# Patient Record
Sex: Female | Born: 1975
Health system: Southern US, Community
[De-identification: ages and names within clinical notes are randomized; demographics above are authoritative.]

## PROBLEM LIST (undated history)

## (undated) DIAGNOSIS — F32A Depression, unspecified: Secondary | ICD-10-CM

## (undated) DIAGNOSIS — F419 Anxiety disorder, unspecified: Secondary | ICD-10-CM

## (undated) DIAGNOSIS — N6310 Unspecified lump in the right breast, unspecified quadrant: Secondary | ICD-10-CM

## (undated) DIAGNOSIS — F329 Major depressive disorder, single episode, unspecified: Secondary | ICD-10-CM

## (undated) DIAGNOSIS — R519 Headache, unspecified: Secondary | ICD-10-CM

## (undated) DIAGNOSIS — R51 Headache: Secondary | ICD-10-CM

## (undated) HISTORY — PX: BREAST BIOPSY: SHX20

---

## 2014-05-28 ENCOUNTER — Other Ambulatory Visit (HOSPITAL_COMMUNITY)
Admission: RE | Admit: 2014-05-28 | Discharge: 2014-05-28 | Disposition: A | Discharge status: Home / Self Care | Payer: BLUE CROSS/BLUE SHIELD | Source: Ambulatory Visit | Attending: Family Medicine | Admitting: Family Medicine

## 2014-05-28 DIAGNOSIS — Z1151 Encounter for screening for human papillomavirus (HPV): Secondary | ICD-10-CM | POA: Diagnosis present

## 2014-05-28 DIAGNOSIS — Z01419 Encounter for gynecological examination (general) (routine) without abnormal findings: Secondary | ICD-10-CM | POA: Diagnosis present

## 2015-04-07 ENCOUNTER — Other Ambulatory Visit: Payer: Self-pay

## 2015-04-07 DIAGNOSIS — Z1231 Encounter for screening mammogram for malignant neoplasm of breast: Secondary | ICD-10-CM

## 2015-04-07 DIAGNOSIS — Z803 Family history of malignant neoplasm of breast: Secondary | ICD-10-CM

## 2015-05-14 ENCOUNTER — Ambulatory Visit
Admission: RE | Admit: 2015-05-14 | Discharge: 2015-05-14 | Disposition: A | Discharge status: Home / Self Care | Payer: BLUE CROSS/BLUE SHIELD | Source: Ambulatory Visit

## 2015-05-14 ENCOUNTER — Other Ambulatory Visit: Payer: Self-pay | Admitting: Family Medicine

## 2015-05-14 DIAGNOSIS — R928 Other abnormal and inconclusive findings on diagnostic imaging of breast: Secondary | ICD-10-CM

## 2015-05-14 DIAGNOSIS — Z803 Family history of malignant neoplasm of breast: Secondary | ICD-10-CM

## 2015-05-14 DIAGNOSIS — Z1231 Encounter for screening mammogram for malignant neoplasm of breast: Secondary | ICD-10-CM

## 2015-05-20 ENCOUNTER — Other Ambulatory Visit: Payer: Self-pay | Admitting: Family Medicine

## 2015-05-20 ENCOUNTER — Ambulatory Visit
Admission: RE | Admit: 2015-05-20 | Discharge: 2015-05-20 | Disposition: A | Discharge status: Home / Self Care | Payer: BLUE CROSS/BLUE SHIELD | Source: Ambulatory Visit | Attending: Family Medicine | Admitting: Family Medicine

## 2015-05-20 DIAGNOSIS — R928 Other abnormal and inconclusive findings on diagnostic imaging of breast: Secondary | ICD-10-CM

## 2015-05-21 ENCOUNTER — Ambulatory Visit
Admission: RE | Admit: 2015-05-21 | Discharge: 2015-05-21 | Disposition: A | Discharge status: Home / Self Care | Payer: BLUE CROSS/BLUE SHIELD | Source: Ambulatory Visit | Attending: Family Medicine | Admitting: Family Medicine

## 2015-05-21 DIAGNOSIS — R928 Other abnormal and inconclusive findings on diagnostic imaging of breast: Secondary | ICD-10-CM

## 2016-03-12 DIAGNOSIS — E559 Vitamin D deficiency, unspecified: Secondary | ICD-10-CM | POA: Diagnosis not present

## 2016-04-19 ENCOUNTER — Other Ambulatory Visit: Payer: Self-pay | Admitting: Family Medicine

## 2016-04-19 DIAGNOSIS — Z1231 Encounter for screening mammogram for malignant neoplasm of breast: Secondary | ICD-10-CM

## 2016-05-10 HISTORY — PX: BREAST LUMPECTOMY: SHX2

## 2016-05-10 HISTORY — PX: BREAST EXCISIONAL BIOPSY: SUR124

## 2016-05-11 ENCOUNTER — Ambulatory Visit: Payer: BLUE CROSS/BLUE SHIELD

## 2016-05-31 ENCOUNTER — Ambulatory Visit
Admission: RE | Admit: 2016-05-31 | Discharge: 2016-05-31 | Disposition: A | Discharge status: Home / Self Care | Payer: BLUE CROSS/BLUE SHIELD | Source: Ambulatory Visit | Attending: Family Medicine | Admitting: Family Medicine

## 2016-05-31 DIAGNOSIS — Z1231 Encounter for screening mammogram for malignant neoplasm of breast: Secondary | ICD-10-CM

## 2016-06-01 ENCOUNTER — Other Ambulatory Visit: Payer: Self-pay | Admitting: Family Medicine

## 2016-06-01 DIAGNOSIS — R928 Other abnormal and inconclusive findings on diagnostic imaging of breast: Secondary | ICD-10-CM

## 2016-06-03 ENCOUNTER — Other Ambulatory Visit: Payer: Self-pay | Admitting: Family Medicine

## 2016-06-03 ENCOUNTER — Ambulatory Visit
Admission: RE | Admit: 2016-06-03 | Discharge: 2016-06-03 | Disposition: A | Discharge status: Home / Self Care | Payer: BLUE CROSS/BLUE SHIELD | Source: Ambulatory Visit | Attending: Family Medicine | Admitting: Family Medicine

## 2016-06-03 ENCOUNTER — Other Ambulatory Visit: Payer: BLUE CROSS/BLUE SHIELD

## 2016-06-03 DIAGNOSIS — N6310 Unspecified lump in the right breast, unspecified quadrant: Secondary | ICD-10-CM

## 2016-06-03 DIAGNOSIS — N6312 Unspecified lump in the right breast, upper inner quadrant: Secondary | ICD-10-CM | POA: Diagnosis not present

## 2016-06-03 DIAGNOSIS — R928 Other abnormal and inconclusive findings on diagnostic imaging of breast: Secondary | ICD-10-CM

## 2016-06-08 ENCOUNTER — Ambulatory Visit
Admission: RE | Admit: 2016-06-08 | Discharge: 2016-06-08 | Disposition: A | Discharge status: Home / Self Care | Payer: BLUE CROSS/BLUE SHIELD | Source: Ambulatory Visit | Attending: Family Medicine | Admitting: Family Medicine

## 2016-06-08 DIAGNOSIS — N649 Disorder of breast, unspecified: Secondary | ICD-10-CM | POA: Diagnosis not present

## 2016-06-08 DIAGNOSIS — N6310 Unspecified lump in the right breast, unspecified quadrant: Secondary | ICD-10-CM

## 2016-06-08 DIAGNOSIS — N6312 Unspecified lump in the right breast, upper inner quadrant: Secondary | ICD-10-CM | POA: Diagnosis not present

## 2016-06-23 ENCOUNTER — Other Ambulatory Visit: Payer: Self-pay | Admitting: General Surgery

## 2016-06-23 DIAGNOSIS — N6310 Unspecified lump in the right breast, unspecified quadrant: Secondary | ICD-10-CM | POA: Diagnosis not present

## 2016-07-09 ENCOUNTER — Other Ambulatory Visit: Payer: Self-pay | Admitting: General Surgery

## 2016-07-09 DIAGNOSIS — N6310 Unspecified lump in the right breast, unspecified quadrant: Secondary | ICD-10-CM

## 2016-07-13 DIAGNOSIS — Z0000 Encounter for general adult medical examination without abnormal findings: Secondary | ICD-10-CM | POA: Diagnosis not present

## 2016-07-13 DIAGNOSIS — Z79899 Other long term (current) drug therapy: Secondary | ICD-10-CM | POA: Diagnosis not present

## 2016-07-13 DIAGNOSIS — E559 Vitamin D deficiency, unspecified: Secondary | ICD-10-CM | POA: Diagnosis not present

## 2016-07-13 DIAGNOSIS — E785 Hyperlipidemia, unspecified: Secondary | ICD-10-CM | POA: Diagnosis not present

## 2016-07-28 ENCOUNTER — Encounter (HOSPITAL_BASED_OUTPATIENT_CLINIC_OR_DEPARTMENT_OTHER): Payer: Self-pay | Admitting: *Deleted

## 2016-08-03 ENCOUNTER — Ambulatory Visit
Admission: RE | Admit: 2016-08-03 | Discharge: 2016-08-03 | Disposition: A | Discharge status: Home / Self Care | Payer: BLUE CROSS/BLUE SHIELD | Source: Ambulatory Visit | Attending: General Surgery | Admitting: General Surgery

## 2016-08-03 DIAGNOSIS — N6310 Unspecified lump in the right breast, unspecified quadrant: Secondary | ICD-10-CM

## 2016-08-03 NOTE — Progress Notes (Signed)
Boost drink given with instructions, pt verbalized understanding.

## 2016-08-04 ENCOUNTER — Encounter (HOSPITAL_BASED_OUTPATIENT_CLINIC_OR_DEPARTMENT_OTHER)
Admission: RE | Disposition: A | Discharge status: Home / Self Care | Payer: Self-pay | Source: Ambulatory Visit | Attending: General Surgery

## 2016-08-04 ENCOUNTER — Encounter (HOSPITAL_BASED_OUTPATIENT_CLINIC_OR_DEPARTMENT_OTHER): Payer: Self-pay | Admitting: *Deleted

## 2016-08-04 ENCOUNTER — Ambulatory Visit (HOSPITAL_BASED_OUTPATIENT_CLINIC_OR_DEPARTMENT_OTHER)
Admission: RE | Admit: 2016-08-04 | Discharge: 2016-08-04 | Disposition: A | Discharge status: Home / Self Care | Payer: BLUE CROSS/BLUE SHIELD | Source: Ambulatory Visit | Attending: General Surgery | Admitting: General Surgery

## 2016-08-04 ENCOUNTER — Ambulatory Visit (HOSPITAL_BASED_OUTPATIENT_CLINIC_OR_DEPARTMENT_OTHER): Payer: BLUE CROSS/BLUE SHIELD | Admitting: Certified Registered"

## 2016-08-04 ENCOUNTER — Ambulatory Visit
Admission: RE | Admit: 2016-08-04 | Discharge: 2016-08-04 | Disposition: A | Discharge status: Home / Self Care | Payer: BLUE CROSS/BLUE SHIELD | Source: Ambulatory Visit | Attending: General Surgery | Admitting: General Surgery

## 2016-08-04 DIAGNOSIS — Z87891 Personal history of nicotine dependence: Secondary | ICD-10-CM | POA: Diagnosis not present

## 2016-08-04 DIAGNOSIS — F329 Major depressive disorder, single episode, unspecified: Secondary | ICD-10-CM | POA: Insufficient documentation

## 2016-08-04 DIAGNOSIS — E7800 Pure hypercholesterolemia, unspecified: Secondary | ICD-10-CM | POA: Insufficient documentation

## 2016-08-04 DIAGNOSIS — F419 Anxiety disorder, unspecified: Secondary | ICD-10-CM | POA: Insufficient documentation

## 2016-08-04 DIAGNOSIS — D241 Benign neoplasm of right breast: Secondary | ICD-10-CM | POA: Diagnosis not present

## 2016-08-04 DIAGNOSIS — N6310 Unspecified lump in the right breast, unspecified quadrant: Secondary | ICD-10-CM | POA: Diagnosis not present

## 2016-08-04 DIAGNOSIS — N6312 Unspecified lump in the right breast, upper inner quadrant: Secondary | ICD-10-CM | POA: Diagnosis not present

## 2016-08-04 HISTORY — DX: Anxiety disorder, unspecified: F41.9

## 2016-08-04 HISTORY — DX: Headache, unspecified: R51.9

## 2016-08-04 HISTORY — DX: Major depressive disorder, single episode, unspecified: F32.9

## 2016-08-04 HISTORY — DX: Headache: R51

## 2016-08-04 HISTORY — PX: RADIOACTIVE SEED GUIDED EXCISIONAL BREAST BIOPSY: SHX6490

## 2016-08-04 HISTORY — DX: Unspecified lump in the right breast, unspecified quadrant: N63.10

## 2016-08-04 HISTORY — DX: Depression, unspecified: F32.A

## 2016-08-04 SURGERY — RADIOACTIVE SEED GUIDED BREAST BIOPSY
Anesthesia: General | Site: Breast | Laterality: Right

## 2016-08-04 MED ORDER — ACETAMINOPHEN 500 MG PO TABS
1000.0000 mg | ORAL_TABLET | ORAL | Status: AC
  Administered 2016-08-04: 1000 mg via ORAL

## 2016-08-04 MED ORDER — PROPOFOL 10 MG/ML IV BOLUS
INTRAVENOUS | Status: DC | PRN
  Administered 2016-08-04: 150 mg via INTRAVENOUS

## 2016-08-04 MED ORDER — CELECOXIB 200 MG PO CAPS
200.0000 mg | ORAL_CAPSULE | ORAL | Status: AC
  Administered 2016-08-04: 200 mg via ORAL

## 2016-08-04 MED ORDER — CEFAZOLIN SODIUM-DEXTROSE 2-4 GM/100ML-% IV SOLN
2.0000 g | INTRAVENOUS | Status: AC
  Administered 2016-08-04: 2 g via INTRAVENOUS

## 2016-08-04 MED ORDER — BUPIVACAINE HCL (PF) 0.25 % IJ SOLN
INTRAMUSCULAR | Status: DC | PRN
  Administered 2016-08-04: 10 mL

## 2016-08-04 MED ORDER — FENTANYL CITRATE (PF) 100 MCG/2ML IJ SOLN
INTRAMUSCULAR | Status: AC
  Filled 2016-08-04: qty 2

## 2016-08-04 MED ORDER — CELECOXIB 200 MG PO CAPS
ORAL_CAPSULE | ORAL | Status: AC
  Filled 2016-08-04: qty 1

## 2016-08-04 MED ORDER — OXYCODONE HCL 5 MG PO TABS: 5.0000 mg | ORAL_TABLET | Freq: Once | ORAL | Status: DC | PRN

## 2016-08-04 MED ORDER — PROMETHAZINE HCL 25 MG/ML IJ SOLN: 6.2500 mg | INTRAMUSCULAR | Status: DC | PRN

## 2016-08-04 MED ORDER — GABAPENTIN 300 MG PO CAPS
ORAL_CAPSULE | ORAL | Status: AC
  Filled 2016-08-04: qty 1

## 2016-08-04 MED ORDER — CEFAZOLIN SODIUM-DEXTROSE 2-4 GM/100ML-% IV SOLN
INTRAVENOUS | Status: AC
  Filled 2016-08-04: qty 100

## 2016-08-04 MED ORDER — MIDAZOLAM HCL 2 MG/2ML IJ SOLN
INTRAMUSCULAR | Status: AC
  Filled 2016-08-04: qty 2

## 2016-08-04 MED ORDER — LACTATED RINGERS IV SOLN
INTRAVENOUS | Status: DC
  Administered 2016-08-04: 09:00:00 via INTRAVENOUS

## 2016-08-04 MED ORDER — HYDROMORPHONE HCL 1 MG/ML IJ SOLN: 0.2500 mg | INTRAMUSCULAR | Status: DC | PRN

## 2016-08-04 MED ORDER — SCOPOLAMINE 1 MG/3DAYS TD PT72: 1.0000 | MEDICATED_PATCH | Freq: Once | TRANSDERMAL | Status: DC | PRN

## 2016-08-04 MED ORDER — ACETAMINOPHEN 500 MG PO TABS
ORAL_TABLET | ORAL | Status: AC
  Filled 2016-08-04: qty 2

## 2016-08-04 MED ORDER — FENTANYL CITRATE (PF) 100 MCG/2ML IJ SOLN
50.0000 ug | INTRAMUSCULAR | Status: AC | PRN
Start: 2016-08-04 — End: 2016-08-04
  Administered 2016-08-04: 50 ug via INTRAVENOUS
  Administered 2016-08-04 (×2): 25 ug via INTRAVENOUS

## 2016-08-04 MED ORDER — GABAPENTIN 300 MG PO CAPS
300.0000 mg | ORAL_CAPSULE | ORAL | Status: AC
  Administered 2016-08-04: 300 mg via ORAL

## 2016-08-04 MED ORDER — MEPERIDINE HCL 25 MG/ML IJ SOLN: 6.2500 mg | INTRAMUSCULAR | Status: DC | PRN

## 2016-08-04 MED ORDER — LIDOCAINE 2% (20 MG/ML) 5 ML SYRINGE
INTRAMUSCULAR | Status: DC | PRN
  Administered 2016-08-04: 60 mg via INTRAVENOUS

## 2016-08-04 MED ORDER — MIDAZOLAM HCL 2 MG/2ML IJ SOLN
1.0000 mg | INTRAMUSCULAR | Status: DC | PRN
  Administered 2016-08-04: 2 mg via INTRAVENOUS

## 2016-08-04 MED ORDER — OXYCODONE HCL 5 MG/5ML PO SOLN: 5.0000 mg | Freq: Once | ORAL | Status: DC | PRN

## 2016-08-04 SURGICAL SUPPLY — 57 items
BINDER BREAST LRG (GAUZE/BANDAGES/DRESSINGS) ×3 IMPLANT
BINDER BREAST MEDIUM (GAUZE/BANDAGES/DRESSINGS) IMPLANT
BINDER BREAST XLRG (GAUZE/BANDAGES/DRESSINGS) IMPLANT
BINDER BREAST XXLRG (GAUZE/BANDAGES/DRESSINGS) IMPLANT
BLADE SURG 15 STRL LF DISP TIS (BLADE) ×1 IMPLANT
BLADE SURG 15 STRL SS (BLADE) ×2
CANISTER SUC SOCK COL 7IN (MISCELLANEOUS) IMPLANT
CANISTER SUCT 1200ML W/VALVE (MISCELLANEOUS) ×3 IMPLANT
CHLORAPREP W/TINT 26ML (MISCELLANEOUS) ×3 IMPLANT
CLIP TI WIDE RED SMALL 6 (CLIP) ×3 IMPLANT
CLOSURE WOUND 1/2 X4 (GAUZE/BANDAGES/DRESSINGS) ×1
COVER BACK TABLE 60X90IN (DRAPES) ×3 IMPLANT
COVER MAYO STAND STRL (DRAPES) ×3 IMPLANT
COVER PROBE W GEL 5X96 (DRAPES) ×3 IMPLANT
DECANTER SPIKE VIAL GLASS SM (MISCELLANEOUS) IMPLANT
DERMABOND ADVANCED (GAUZE/BANDAGES/DRESSINGS) ×2
DERMABOND ADVANCED .7 DNX12 (GAUZE/BANDAGES/DRESSINGS) ×1 IMPLANT
DEVICE DUBIN W/COMP PLATE 8390 (MISCELLANEOUS) ×3 IMPLANT
DRAPE LAPAROSCOPIC ABDOMINAL (DRAPES) ×3 IMPLANT
DRAPE UTILITY XL STRL (DRAPES) ×3 IMPLANT
DRSG TEGADERM 4X4.75 (GAUZE/BANDAGES/DRESSINGS) IMPLANT
ELECT COATED BLADE 2.86 ST (ELECTRODE) ×3 IMPLANT
ELECT REM PT RETURN 9FT ADLT (ELECTROSURGICAL) ×3
ELECTRODE REM PT RTRN 9FT ADLT (ELECTROSURGICAL) ×1 IMPLANT
GAUZE SPONGE 4X4 12PLY STRL LF (GAUZE/BANDAGES/DRESSINGS) IMPLANT
GLOVE BIO SURGEON STRL SZ 6.5 (GLOVE) ×2 IMPLANT
GLOVE BIO SURGEON STRL SZ7 (GLOVE) ×9 IMPLANT
GLOVE BIO SURGEONS STRL SZ 6.5 (GLOVE) ×1
GLOVE BIOGEL PI IND STRL 7.5 (GLOVE) ×1 IMPLANT
GLOVE BIOGEL PI INDICATOR 7.5 (GLOVE) ×2
GOWN STRL REUS W/ TWL LRG LVL3 (GOWN DISPOSABLE) ×3 IMPLANT
GOWN STRL REUS W/TWL LRG LVL3 (GOWN DISPOSABLE) ×6
HEMOSTAT ARISTA ABSORB 3G PWDR (MISCELLANEOUS) IMPLANT
ILLUMINATOR WAVEGUIDE N/F (MISCELLANEOUS) IMPLANT
KIT MARKER MARGIN INK (KITS) ×3 IMPLANT
LIGHT WAVEGUIDE WIDE FLAT (MISCELLANEOUS) IMPLANT
NEEDLE HYPO 25X1 1.5 SAFETY (NEEDLE) ×3 IMPLANT
NS IRRIG 1000ML POUR BTL (IV SOLUTION) ×3 IMPLANT
PACK BASIN DAY SURGERY FS (CUSTOM PROCEDURE TRAY) ×3 IMPLANT
PENCIL BUTTON HOLSTER BLD 10FT (ELECTRODE) ×3 IMPLANT
SLEEVE SCD COMPRESS KNEE MED (MISCELLANEOUS) ×3 IMPLANT
SPONGE LAP 4X18 X RAY DECT (DISPOSABLE) ×3 IMPLANT
STRIP CLOSURE SKIN 1/2X4 (GAUZE/BANDAGES/DRESSINGS) ×2 IMPLANT
SUT MNCRL AB 4-0 PS2 18 (SUTURE) IMPLANT
SUT MON AB 5-0 PS2 18 (SUTURE) ×3 IMPLANT
SUT SILK 2 0 SH (SUTURE) IMPLANT
SUT VIC AB 2-0 SH 27 (SUTURE) ×2
SUT VIC AB 2-0 SH 27XBRD (SUTURE) ×1 IMPLANT
SUT VIC AB 3-0 SH 27 (SUTURE) ×2
SUT VIC AB 3-0 SH 27X BRD (SUTURE) ×1 IMPLANT
SUT VIC AB 5-0 PS2 18 (SUTURE) IMPLANT
SYR CONTROL 10ML LL (SYRINGE) ×3 IMPLANT
TOWEL OR 17X24 6PK STRL BLUE (TOWEL DISPOSABLE) ×3 IMPLANT
TOWEL OR NON WOVEN STRL DISP B (DISPOSABLE) ×3 IMPLANT
TUBE CONNECTING 20'X1/4 (TUBING) ×1
TUBE CONNECTING 20X1/4 (TUBING) ×2 IMPLANT
YANKAUER SUCT BULB TIP NO VENT (SUCTIONS) ×3 IMPLANT

## 2016-08-04 NOTE — Discharge Instructions (Signed)
[facility] °Office Phone Number [phone] ° °POST OP INSTRUCTIONS ° °Always review your discharge instruction sheet given to you by the facility where your surgery was performed. ° °IF YOU HAVE DISABILITY OR FAMILY LEAVE FORMS, YOU MUST BRING THEM TO THE OFFICE FOR PROCESSING.  DO NOT GIVE THEM TO YOUR DOCTOR. ° °1. A prescription for pain medication may be given to you upon discharge.  Take your pain medication as prescribed, if needed.  If narcotic pain medicine is not needed, then you may take acetaminophen (Tylenol), naprosyn (Alleve) or ibuprofen (Advil) as needed. °2. Take your usually prescribed medications unless otherwise directed °3. If you need a refill on your pain medication, please contact your pharmacy.  They will contact our office to request authorization.  Prescriptions will not be filled after 5pm or on week-ends. °4. You should eat very light the first 24 hours after surgery, such as soup, crackers, pudding, etc.  Resume your normal diet the day after surgery. °5. Most patients will experience some swelling and bruising in the breast.  Ice packs and a good support bra will help.  Wear the breast binder provided or a sports bra for 72 hours day and night.  After that wear a sports bra during the day until you return to the office. Swelling and bruising can take several days to resolve.  °6. It is common to experience some constipation if taking pain medication after surgery.  Increasing fluid intake and taking a stool softener will usually help or prevent this problem from occurring.  A mild laxative (Milk of Magnesia or Miralax) should be taken according to package directions if there are no bowel movements after 48 hours. °7. Unless discharge instructions indicate otherwise, you may remove your bandages 48 hours after surgery and you may shower at that time.  You may have steri-strips (small skin tapes) in place directly over the incision.  These strips should be left on the  skin for 7-10 days and will come off on their own.  If your surgeon used skin glue on the incision, you may shower in 24 hours.  The glue will flake off over the next 2-3 weeks.  Any sutures or staples will be removed at the office during your follow-up visit. °8. ACTIVITIES:  You may resume regular daily activities (gradually increasing) beginning the next day.  Wearing a good support bra or sports bra minimizes pain and swelling.  You may have sexual intercourse when it is comfortable. °a. You may drive when you no longer are taking prescription pain medication, you can comfortably wear a seatbelt, and you can safely maneuver your car and apply brakes. °b. RETURN TO WORK:  ______________________________________________________________________________________ °9. You should see your doctor in the office for a follow-up appointment approximately two weeks after your surgery.  Your doctor’s nurse will typically make your follow-up appointment when she calls you with your pathology report.  Expect your pathology report 3-4 business days after your surgery.  You may call to check if you do not hear from us after three days. °10. OTHER INSTRUCTIONS: _______________________________________________________________________________________________ _____________________________________________________________________________________________________________________________________ °_____________________________________________________________________________________________________________________________________ °_____________________________________________________________________________________________________________________________________ ° °WHEN TO CALL DR WAKEFIELD: °1. Fever over 101.0 °2. Nausea and/or vomiting. °3. Extreme swelling or bruising. °4. Continued bleeding from incision. °5. Increased pain, redness, or drainage from the incision. ° °The clinic staff is available to answer your questions during regular  business hours.  Please don’t hesitate to call and ask to speak to one of the nurses for clinical concerns.  If   you have a medical emergency, go to the nearest emergency room or call 911.  A surgeon from [facility] is always on call at the hospital. ° °For further questions, please visit [email or web address] ° ° ° ° ° °Post Anesthesia Home Care Instructions ° °Activity: °Get plenty of rest for the remainder of the day. A responsible individual must stay with you for 24 hours following the procedure.  °For the next 24 hours, DO NOT: °-Drive a car °-Operate machinery °-Drink alcoholic beverages °-Take any medication unless instructed by your physician °-Make any legal decisions or sign important papers. ° °Meals: °Start with liquid foods such as gelatin or soup. Progress to regular foods as tolerated. Avoid greasy, spicy, heavy foods. If nausea and/or vomiting occur, drink only clear liquids until the nausea and/or vomiting subsides. Call your physician if vomiting continues. ° °Special Instructions/Symptoms: °Your throat may feel dry or sore from the anesthesia or the breathing tube placed in your throat during surgery. If this causes discomfort, gargle with warm salt water. The discomfort should disappear within 24 hours. ° °If you had a scopolamine patch placed behind your ear for the management of post- operative nausea and/or vomiting: ° °1. The medication in the patch is effective for 72 hours, after which it should be removed.  Wrap patch in a tissue and discard in the trash. Wash hands thoroughly with soap and water. °2. You may remove the patch earlier than 72 hours if you experience unpleasant side effects which may include dry mouth, dizziness or visual disturbances. °3. Avoid touching the patch. Wash your hands with soap and water after contact with the patch. °  ° °

## 2016-08-04 NOTE — H&P (Signed)
41 yof referred by Dr Jonathon Jordan for abnl mm. she has fh in her mom who recently passed from breast cancer. no real personal history. she has no mass or dc. she underwent screening mm with c density breasts. there is a 7x7x9 mm mass at 2 oclock 2 cm from the nipple and there is a 9x3x6 mm mass 6 cm from nipple. the 6 cm one appears to be im node and is recommended six month follow up. the other underwent core and is a biphasic lesion. she is here to discuss options as differential includes a low grade phyllodes tumor   Past Surgical History  Breast Biopsy  Bilateral. Cesarean Section - 1   Diagnostic Studies History Colonoscopy  never Mammogram  within last year Pap Smear  1-5 years ago  Allergies  No Known Drug Allergies   Medication History  Sertraline HCl (50MG  Tablet, Oral) Active. Vitamin D (Cholecalciferol) (1000UNIT Tablet, Oral) Active. Medications Reconciled  Social History Alcohol use  Moderate alcohol use. Caffeine use  Carbonated beverages, Coffee, Tea. Illicit drug use  Remotely quit drug use. Tobacco use  Former smoker.  Family History  Alcohol Abuse  Sister. Arthritis  Father, Mother. Breast Cancer  Mother. Colon Cancer  Family Members In General. Depression  Mother, Sister. Diabetes Mellitus  Mother, Sister. Heart Disease  Father. Hypertension  Father. Kidney Disease  Son. Malignant Neoplasm Of Pancreas  Family Members In General.  Pregnancy / Birth History Age at menarche  41 years. Gravida  3 Maternal age  42-20 Para  1 Regular periods   Other Problems  Anxiety Disorder  Depression  Hypercholesterolemia  Lump In Breast  Migraine Headache   Review of Systems General Present- Fatigue. Not Present- Appetite Loss, Chills, Fever, Night Sweats, Weight Gain and Weight Loss. Skin Not Present- Change in Wart/Mole, Dryness, Hives, Jaundice, New Lesions, Non-Healing Wounds, Rash and Ulcer. HEENT Present- Sinus  Pain. Not Present- Earache, Hearing Loss, Hoarseness, Nose Bleed, Oral Ulcers, Ringing in the Ears, Seasonal Allergies, Sore Throat, Visual Disturbances, Wears glasses/contact lenses and Yellow Eyes. Respiratory Present- Snoring. Not Present- Bloody sputum, Chronic Cough, Difficulty Breathing and Wheezing. Breast Present- Breast Mass. Not Present- Breast Pain, Nipple Discharge and Skin Changes. Cardiovascular Not Present- Chest Pain, Difficulty Breathing Lying Down, Leg Cramps, Palpitations, Rapid Heart Rate, Shortness of Breath and Swelling of Extremities. Gastrointestinal Not Present- Abdominal Pain, Bloating, Bloody Stool, Change in Bowel Habits, Chronic diarrhea, Constipation, Difficulty Swallowing, Excessive gas, Gets full quickly at meals, Hemorrhoids, Indigestion, Nausea, Rectal Pain and Vomiting. Female Genitourinary Not Present- Frequency, Nocturia, Painful Urination, Pelvic Pain and Urgency. Musculoskeletal Present- Joint Pain. Not Present- Back Pain, Joint Stiffness, Muscle Pain, Muscle Weakness and Swelling of Extremities. Neurological Not Present- Decreased Memory, Fainting, Headaches, Numbness, Seizures, Tingling, Tremor, Trouble walking and Weakness. Psychiatric Present- Anxiety and Depression. Not Present- Bipolar, Change in Sleep Pattern, Fearful and Frequent crying. Endocrine Not Present- Cold Intolerance, Excessive Hunger, Hair Changes, Heat Intolerance, Hot flashes and New Diabetes. Hematology Not Present- Blood Thinners, Easy Bruising, Excessive bleeding, Gland problems, HIV and Persistent Infections.  Vitals  Weight: 166 lb Height: 63in Body Surface Area: 1.79 m Body Mass Index: 29.41 kg/m  Temp.: 97.56F  Pulse: 70 (Regular)  P.OX: 98% (Room air) BP: 110/82 (Sitting, Left Arm, Standard) Physical Exam  General Mental Status-Alert. Orientation-Oriented X3. Chest and Lung Exam Chest and lung exam reveals -on auscultation, normal breath sounds, no  adventitious sounds and normal vocal resonance. Breast Nipples-No Discharge. Breast Lump-No Palpable Breast Mass.  Cardiovascular Cardiovascular examination reveals -normal heart sounds, regular rate and rhythm with no murmurs. Lymphatic Head & Neck General Head & Neck Lymphatics: Bilateral - Description - Normal. Axillary General Axillary Region: Bilateral - Description - Normal. Note: no Roosevelt adenopathy   Assessment & Plan  MASS OF RIGHT BREAST ON MAMMOGRAM (N63.10) Story: right breast seed guided excisional biopsy discussed options of observation vs excision. I think with fh and her age excision reasonable. we discussed surgery, recovery and risks. will proceed when she decides

## 2016-08-04 NOTE — Anesthesia Preprocedure Evaluation (Signed)
Anesthesia Evaluation  Patient identified by MRN, date of birth, ID band Patient awake    Reviewed: Allergy & Precautions, NPO status , Patient's Chart, lab work & pertinent test results  Airway Mallampati: II  TM Distance: >3 FB Neck ROM: Full    Dental no notable dental hx.    Pulmonary former smoker,    Pulmonary exam normal breath sounds clear to auscultation       Cardiovascular negative cardio ROS Normal cardiovascular exam Rhythm:Regular Rate:Normal     Neuro/Psych  Headaches, negative psych ROS   GI/Hepatic negative GI ROS, Neg liver ROS,   Endo/Other  negative endocrine ROS  Renal/GU negative Renal ROS     Musculoskeletal negative musculoskeletal ROS (+)   Abdominal   Peds  Hematology negative hematology ROS (+)   Anesthesia Other Findings   Reproductive/Obstetrics negative OB ROS                            Anesthesia Physical Anesthesia Plan  ASA: II  Anesthesia Plan: General   Post-op Pain Management:    Induction: Intravenous  Airway Management Planned: LMA  Additional Equipment:   Intra-op Plan:   Post-operative Plan: Extubation in OR  Informed Consent: I have reviewed the patients History and Physical, chart, labs and discussed the procedure including the risks, benefits and alternatives for the proposed anesthesia with the patient or authorized representative who has indicated his/her understanding and acceptance.   Dental advisory given  Plan Discussed with: CRNA  Anesthesia Plan Comments:         Anesthesia Quick Evaluation

## 2016-08-04 NOTE — Anesthesia Procedure Notes (Signed)
Procedure Name: LMA Insertion Date/Time: 08/04/2016 10:08 AM Performed by: Orlondo Holycross D Pre-anesthesia Checklist: Patient identified, Emergency Drugs available, Suction available and Patient being monitored Patient Re-evaluated:Patient Re-evaluated prior to inductionOxygen Delivery Method: Circle system utilized Preoxygenation: Pre-oxygenation with 100% oxygen Intubation Type: IV induction Ventilation: Mask ventilation without difficulty LMA: LMA inserted LMA Size: 4.0 Number of attempts: 1 Airway Equipment and Method: Bite block Placement Confirmation: positive ETCO2 Tube secured with: Tape Dental Injury: Teeth and Oropharynx as per pre-operative assessment

## 2016-08-04 NOTE — Transfer of Care (Signed)
Immediate Anesthesia Transfer of Care Note  Patient: Wanda Melton  Procedure(s) Performed: Procedure(s): RADIOACTIVE SEED GUIDED EXCISIONAL RIGHT BREAST BIOPSY (Right)  Patient Location: PACU  Anesthesia Type:General  Level of Consciousness: awake and patient cooperative  Airway & Oxygen Therapy: Patient Spontanous Breathing and Patient connected to face mask oxygen  Post-op Assessment: Report given to RN and Post -op Vital signs reviewed and stable  Post vital signs: Reviewed and stable  Last Vitals:  Vitals:   08/04/16 0834  BP: 121/71  Pulse: 87  Resp: 20  Temp: 36.7 C    Last Pain:  Vitals:   08/04/16 0834  TempSrc: Oral         Complications: No apparent anesthesia complications

## 2016-08-04 NOTE — Op Note (Signed)
Preoperative diagnoses: right breast mass with core c/w fibroepithelial lesion cannot rule out phyllodes tumor Postoperative diagnosis: Same as above Procedure:Rightbreast seed guided excisional biopsy Surgeon: Dr. Serita Grammes Anesthesia: Gen. Estimated blood loss: minimal Complications: None Drains: None Specimens:Rightbreast tissue with paint Sponge and needle count correct at completion Disposition to recovery stable  Indications: This is a 37 yof with right breast mass on mm that underwent core biopsy and is a fibroepithelial lesion We discussed excisional biopsy using seed guidance. She had seed placed prior to beginning and the mammogramswere in the room.   Procedure: After informed consent was obtained she was then taken to the operating room. She was given cefazolin. Sequential compression devices were on her legs. She was placed under general anesthesia without complication. Her rigth breast was then prepped and draped in the standard sterile surgical fashion. A surgical timeout was then performed.  I located the radioactive seed with the neoprobe. I infiltrated marcaine in the area of the seed. I made a periareolar incision to hide the scar. I then used the neoprobe to guide the excision of the seed and surrounding tissue.This was confirmed by the neoprobe. This was then taken for mammogram which confirmed removal of the seed and the clip. This was confirmed by radiology. This was then sent to pathology. Hemostasis was observed.I closed the breast tissue with a 2-0 Vicryl. The dermis was closed with 3-0 Vicryl and the skin with 5-0 Monocryl.Dermabond and steristrips were placed on the incision. A breast binder was placed. She was transferred to recovery stable

## 2016-08-04 NOTE — Interval H&P Note (Signed)
History and Physical Interval Note:  08/04/2016 9:47 AM  Wanda Melton  has presented today for surgery, with the diagnosis of RIGHT BREAST MASS  The various methods of treatment have been discussed with the patient and family. After consideration of risks, benefits and other options for treatment, the patient has consented to  Procedure(s): RADIOACTIVE SEED GUIDED EXCISIONAL RIGHT BREAST BIOPSY (Right) as a surgical intervention .  The patient's history has been reviewed, patient examined, no change in status, stable for surgery.  I have reviewed the patient's chart and labs.  Questions were answered to the patient's satisfaction.     Terianna Peggs

## 2016-08-05 ENCOUNTER — Encounter (HOSPITAL_BASED_OUTPATIENT_CLINIC_OR_DEPARTMENT_OTHER): Payer: Self-pay | Admitting: General Surgery

## 2016-08-05 NOTE — Anesthesia Postprocedure Evaluation (Signed)
Anesthesia Post Note  Patient: Wanda Melton  Procedure(s) Performed: Procedure(s) (LRB): RADIOACTIVE SEED GUIDED EXCISIONAL RIGHT BREAST BIOPSY (Right)  Patient location during evaluation: PACU Anesthesia Type: General Level of consciousness: sedated and patient cooperative Pain management: pain level controlled Vital Signs Assessment: post-procedure vital signs reviewed and stable Respiratory status: spontaneous breathing Cardiovascular status: stable Anesthetic complications: no       Last Vitals:  Vitals:   08/04/16 1115 08/04/16 1134  BP: 118/71 121/76  Pulse: (!) 101 81  Resp: (!) 44 16  Temp:  36.4 C    Last Pain:  Vitals:   08/04/16 1134  TempSrc: Oral  PainSc: 0-No pain                 Nolon Nations

## 2016-12-24 DIAGNOSIS — N6001 Solitary cyst of right breast: Secondary | ICD-10-CM | POA: Diagnosis not present

## 2017-05-04 ENCOUNTER — Other Ambulatory Visit: Payer: Self-pay | Admitting: Family Medicine

## 2017-05-04 DIAGNOSIS — Z1231 Encounter for screening mammogram for malignant neoplasm of breast: Secondary | ICD-10-CM

## 2017-06-06 ENCOUNTER — Ambulatory Visit
Admission: RE | Admit: 2017-06-06 | Discharge: 2017-06-06 | Disposition: A | Discharge status: Home / Self Care | Payer: BLUE CROSS/BLUE SHIELD | Source: Ambulatory Visit | Attending: Family Medicine | Admitting: Family Medicine

## 2017-06-06 DIAGNOSIS — Z1231 Encounter for screening mammogram for malignant neoplasm of breast: Secondary | ICD-10-CM | POA: Diagnosis not present

## 2017-08-09 ENCOUNTER — Other Ambulatory Visit (HOSPITAL_COMMUNITY)
Admission: RE | Admit: 2017-08-09 | Discharge: 2017-08-09 | Disposition: A | Discharge status: Home / Self Care | Payer: BLUE CROSS/BLUE SHIELD | Source: Ambulatory Visit | Attending: Family Medicine | Admitting: Family Medicine

## 2017-08-09 ENCOUNTER — Other Ambulatory Visit: Payer: Self-pay | Admitting: Family Medicine

## 2017-08-09 DIAGNOSIS — Z124 Encounter for screening for malignant neoplasm of cervix: Secondary | ICD-10-CM | POA: Diagnosis not present

## 2017-08-09 DIAGNOSIS — N920 Excessive and frequent menstruation with regular cycle: Secondary | ICD-10-CM | POA: Diagnosis not present

## 2017-08-09 DIAGNOSIS — Z0000 Encounter for general adult medical examination without abnormal findings: Secondary | ICD-10-CM | POA: Diagnosis not present

## 2017-08-09 DIAGNOSIS — E785 Hyperlipidemia, unspecified: Secondary | ICD-10-CM | POA: Diagnosis not present

## 2017-08-09 DIAGNOSIS — E559 Vitamin D deficiency, unspecified: Secondary | ICD-10-CM | POA: Diagnosis not present

## 2017-08-09 DIAGNOSIS — Z01411 Encounter for gynecological examination (general) (routine) with abnormal findings: Secondary | ICD-10-CM | POA: Diagnosis not present

## 2017-08-09 DIAGNOSIS — Z79899 Other long term (current) drug therapy: Secondary | ICD-10-CM | POA: Diagnosis not present

## 2017-08-11 LAB — CYTOLOGY - PAP: Diagnosis: NEGATIVE

## 2017-08-12 DIAGNOSIS — N920 Excessive and frequent menstruation with regular cycle: Secondary | ICD-10-CM | POA: Diagnosis not present

## 2017-09-23 DIAGNOSIS — N959 Unspecified menopausal and perimenopausal disorder: Secondary | ICD-10-CM | POA: Diagnosis not present

## 2017-09-23 DIAGNOSIS — N939 Abnormal uterine and vaginal bleeding, unspecified: Secondary | ICD-10-CM | POA: Diagnosis not present

## 2017-10-06 DIAGNOSIS — L400 Psoriasis vulgaris: Secondary | ICD-10-CM | POA: Diagnosis not present

## 2017-10-06 DIAGNOSIS — M7711 Lateral epicondylitis, right elbow: Secondary | ICD-10-CM | POA: Diagnosis not present

## 2017-10-18 ENCOUNTER — Ambulatory Visit (INDEPENDENT_AMBULATORY_CARE_PROVIDER_SITE_OTHER): Payer: BLUE CROSS/BLUE SHIELD

## 2017-10-18 ENCOUNTER — Ambulatory Visit (INDEPENDENT_AMBULATORY_CARE_PROVIDER_SITE_OTHER): Payer: BLUE CROSS/BLUE SHIELD | Admitting: Orthopaedic Surgery

## 2017-10-18 DIAGNOSIS — M25521 Pain in right elbow: Secondary | ICD-10-CM

## 2017-10-18 MED ORDER — PREDNISONE 10 MG (21) PO TBPK: ORAL_TABLET | ORAL | 0 refills | Status: AC

## 2017-10-18 NOTE — Progress Notes (Signed)
Office Visit Note   Patient: Wanda Melton           Date of Birth: 02-28-1976           MRN: [id number] Visit Date: 10/18/2017              Requested by: Devra Dopp, MD [place], [place], [place] [address] PCP: Jonathon Jordan, MD   Assessment & Plan: Visit Diagnoses:  1. Pain in right elbow     Plan: the patient appears to be most symptomatic at the radiocapitellar joint with pronation and supination.  We proposed cortisone injection vs. Prednisone taper.  Patient would like to try a prednisone taper first.  She will follow up with [place] as needed.  Call with concerns or questions.  Follow-Up Instructions: Return if symptoms worsen or fail to improve.   Orders:  Orders Placed This Encounter  Procedures  . XR Elbow Complete Right (3+View)   Meds ordered this encounter  Medications  . predniSONE (STERAPRED UNI-PAK 21 TAB) 10 MG (21) TBPK tablet    Sig: Take as directed    Dispense:  21 tablet    Refill:  0      Procedures: No procedures performed   Clinical Data: No additional findings.   Subjective: Chief Complaint  Patient presents with  . Right Elbow - Pain    HPI patient is a pleasant 42 year old right-hand-dominant female who presents to our clinic today with right elbow pain.  The pain she has is been ongoing for the past 4 to 5 months.  It has not worsened but it has not improved either.  The pain is to the lateral aspect.  Pain is worse with the extremes of flexion, extension as well as supination.  She also has pain at night which wakes her from her sleep.  Nothing seems to improve her symptoms.  She has taken some Aleve without relief.  No numbness, tingling or burning.    Review of Systems as detailed in HPI.  All others reviewed and are negative.   Objective: Vital Signs: There were no vitals taken for this visit.  Physical Exam well-developed well-nourished female in no acute distress.  Alert and oriented x3.  Ortho  Exam examination of the right elbow reveals minimal tenderness over the lateral epicondyle.  Mild tenderness over the radial head.  She does have moderate tenderness to the soft spot between the olecranon and radial head.  No elbow joint effusion.  Increased pain at the radiocapitellar joint with supination and pronation of the forearm as well as full extension and flexion.  No pain with gripping or wrist flexion or extension.  She is neurovascularly intact distally.  Negative Tinel radial tunnel.  No pain with resisted ECRB or wrist extension.  Specialty Comments:  No specialty comments available.  Imaging: Xr Elbow Complete Right (3+view)  Result Date: 10/18/2017 No acute or structural abnormalities    PMFS History: Patient Active Problem List   Diagnosis Date Noted  . Pain in right elbow 10/18/2017   Past Medical History:  Diagnosis Date  . Anxiety   . Breast mass, right   . Depression   . Headache     No family history on file.  Past Surgical History:  Procedure Laterality Date  . BREAST BIOPSY Left   . BREAST LUMPECTOMY Right 2018   Benign  . CESAREAN SECTION    . RADIOACTIVE SEED GUIDED EXCISIONAL BREAST BIOPSY Right 08/04/2016   Procedure: RADIOACTIVE  SEED GUIDED EXCISIONAL RIGHT BREAST BIOPSY;  Surgeon: Rolm Bookbinder, MD;  Location: [facility];  Service: General;  Laterality: Right;   Social History   Occupational History  . Not on file  Tobacco Use  . Smoking status: Former Research scientist (life sciences)  . Smokeless tobacco: Never Used  Substance and Sexual Activity  . Alcohol use: Yes    Comment: social  . Drug use: No  . Sexual activity: Not on file

## 2017-12-27 DIAGNOSIS — N939 Abnormal uterine and vaginal bleeding, unspecified: Secondary | ICD-10-CM | POA: Diagnosis not present

## 2017-12-27 DIAGNOSIS — N959 Unspecified menopausal and perimenopausal disorder: Secondary | ICD-10-CM | POA: Diagnosis not present

## 2018-01-10 DIAGNOSIS — M2550 Pain in unspecified joint: Secondary | ICD-10-CM | POA: Diagnosis not present

## 2018-01-10 DIAGNOSIS — R5383 Other fatigue: Secondary | ICD-10-CM | POA: Diagnosis not present

## 2018-02-20 DIAGNOSIS — M2550 Pain in unspecified joint: Secondary | ICD-10-CM | POA: Diagnosis not present

## 2018-02-20 DIAGNOSIS — L400 Psoriasis vulgaris: Secondary | ICD-10-CM | POA: Diagnosis not present

## 2018-03-24 ENCOUNTER — Other Ambulatory Visit: Payer: Self-pay | Admitting: Family Medicine

## 2018-03-24 DIAGNOSIS — Z1231 Encounter for screening mammogram for malignant neoplasm of breast: Secondary | ICD-10-CM

## 2018-05-23 DIAGNOSIS — M2550 Pain in unspecified joint: Secondary | ICD-10-CM | POA: Diagnosis not present

## 2018-05-23 DIAGNOSIS — L400 Psoriasis vulgaris: Secondary | ICD-10-CM | POA: Diagnosis not present

## 2018-06-07 ENCOUNTER — Ambulatory Visit
Admission: RE | Admit: 2018-06-07 | Discharge: 2018-06-07 | Disposition: A | Discharge status: Home / Self Care | Payer: BLUE CROSS/BLUE SHIELD | Source: Ambulatory Visit | Attending: Family Medicine | Admitting: Family Medicine

## 2018-06-07 DIAGNOSIS — Z1231 Encounter for screening mammogram for malignant neoplasm of breast: Secondary | ICD-10-CM

## 2018-10-06 DIAGNOSIS — L409 Psoriasis, unspecified: Secondary | ICD-10-CM | POA: Diagnosis not present

## 2018-10-06 DIAGNOSIS — F329 Major depressive disorder, single episode, unspecified: Secondary | ICD-10-CM | POA: Diagnosis not present

## 2018-10-23 DIAGNOSIS — M2550 Pain in unspecified joint: Secondary | ICD-10-CM | POA: Diagnosis not present

## 2018-10-23 DIAGNOSIS — L400 Psoriasis vulgaris: Secondary | ICD-10-CM | POA: Diagnosis not present

## 2018-11-24 DIAGNOSIS — Z01411 Encounter for gynecological examination (general) (routine) with abnormal findings: Secondary | ICD-10-CM | POA: Diagnosis not present

## 2018-12-13 DIAGNOSIS — R234 Changes in skin texture: Secondary | ICD-10-CM | POA: Diagnosis not present

## 2018-12-14 ENCOUNTER — Other Ambulatory Visit: Payer: Self-pay | Admitting: Family Medicine

## 2018-12-14 DIAGNOSIS — R234 Changes in skin texture: Secondary | ICD-10-CM

## 2018-12-21 ENCOUNTER — Ambulatory Visit: Payer: BLUE CROSS/BLUE SHIELD

## 2018-12-21 ENCOUNTER — Ambulatory Visit
Admission: RE | Admit: 2018-12-21 | Discharge: 2018-12-21 | Disposition: A | Discharge status: Home / Self Care | Payer: BLUE CROSS/BLUE SHIELD | Source: Ambulatory Visit | Attending: Family Medicine | Admitting: Family Medicine

## 2018-12-21 ENCOUNTER — Other Ambulatory Visit: Payer: Self-pay

## 2018-12-21 DIAGNOSIS — R922 Inconclusive mammogram: Secondary | ICD-10-CM | POA: Diagnosis not present

## 2018-12-21 DIAGNOSIS — R234 Changes in skin texture: Secondary | ICD-10-CM

## 2019-03-17 DIAGNOSIS — Z20828 Contact with and (suspected) exposure to other viral communicable diseases: Secondary | ICD-10-CM | POA: Diagnosis not present

## 2019-04-02 ENCOUNTER — Other Ambulatory Visit: Payer: Self-pay

## 2019-04-02 DIAGNOSIS — Z20822 Contact with and (suspected) exposure to covid-19: Secondary | ICD-10-CM

## 2019-04-04 LAB — NOVEL CORONAVIRUS, NAA: SARS-CoV-2, NAA: NOT DETECTED

## 2019-04-30 ENCOUNTER — Other Ambulatory Visit: Payer: Self-pay | Admitting: Family Medicine

## 2019-04-30 DIAGNOSIS — Z1231 Encounter for screening mammogram for malignant neoplasm of breast: Secondary | ICD-10-CM

## 2019-05-12 IMAGING — MG 2D DIGITAL SCREENING BILATERAL MAMMOGRAM WITH 3D TOMO WITH CAD
8 of 12 series · 8 of 28 positions shown · non-contrast
Comparison: Previous exam(s).

CLINICAL DATA: Screening.

EXAM:
2D DIGITAL SCREENING BILATERAL MAMMOGRAM WITH 3D TOMO WITH CAD

[L CC synth-2D]
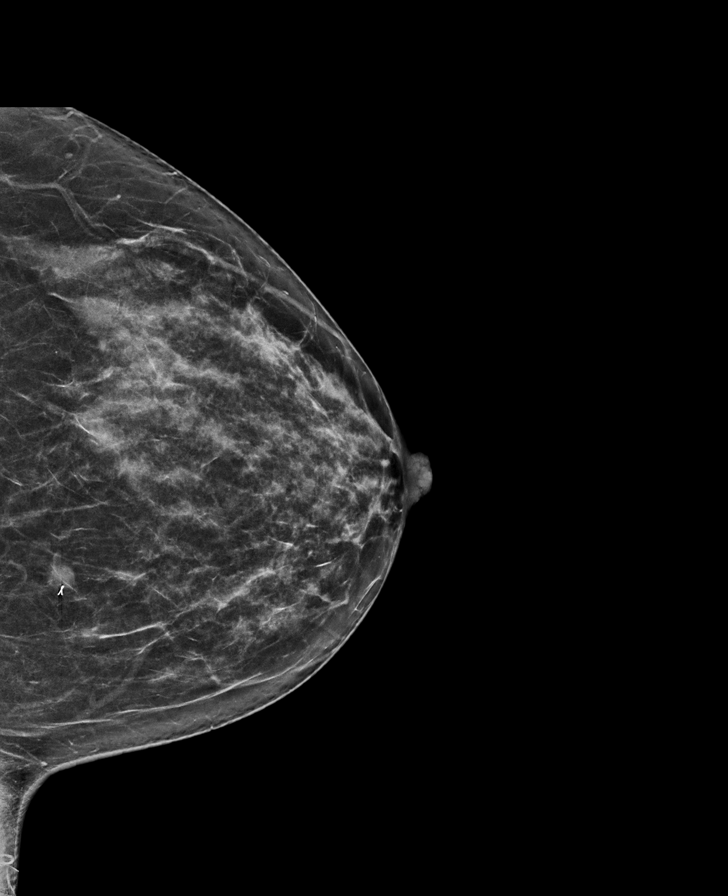

[L CC]
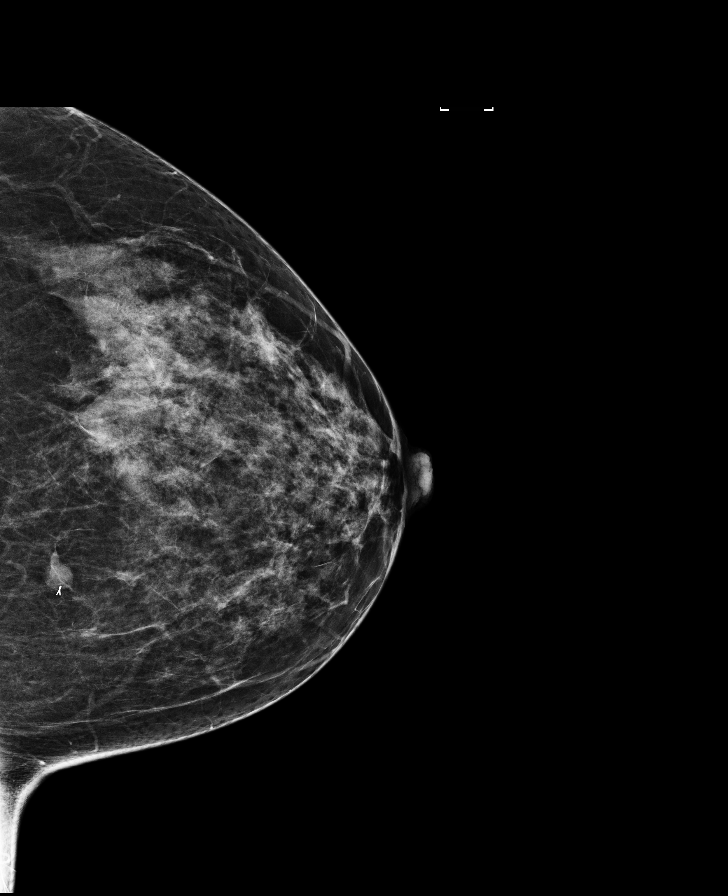

[L MLO]
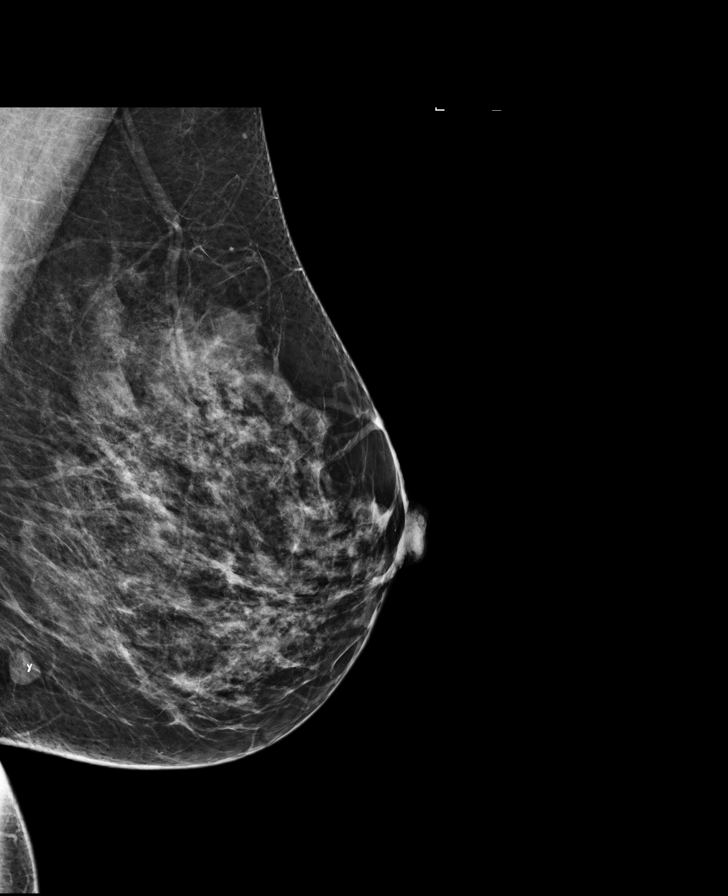

[R MLO]
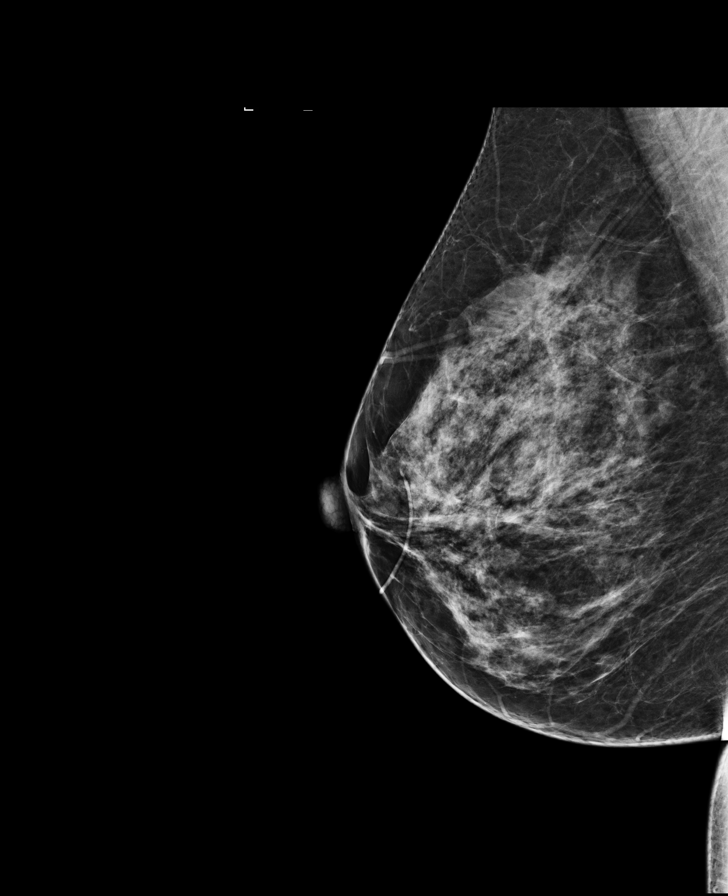

[R CC]
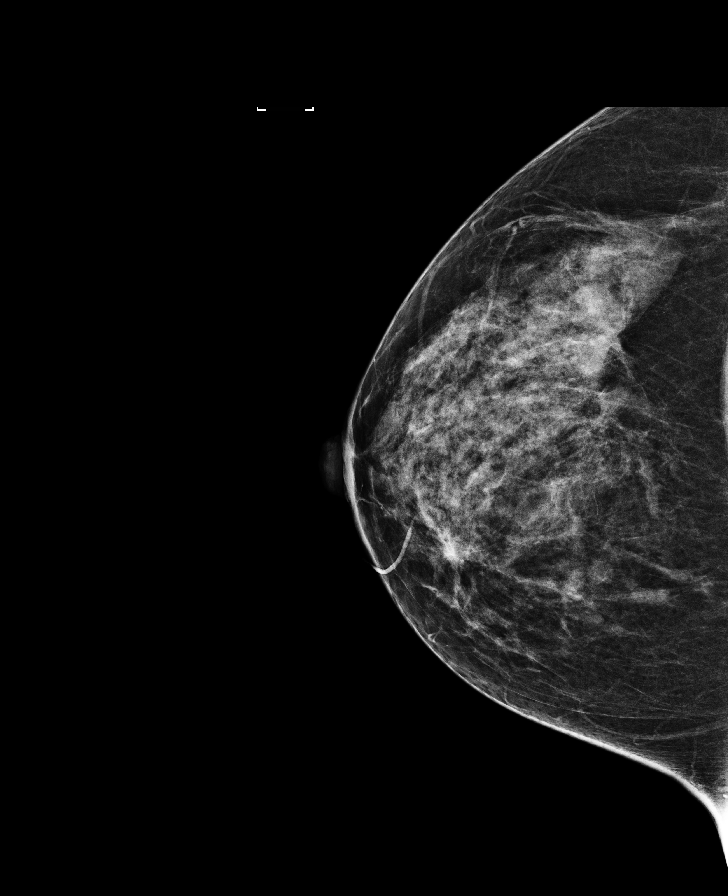

[L MLO synth-2D]
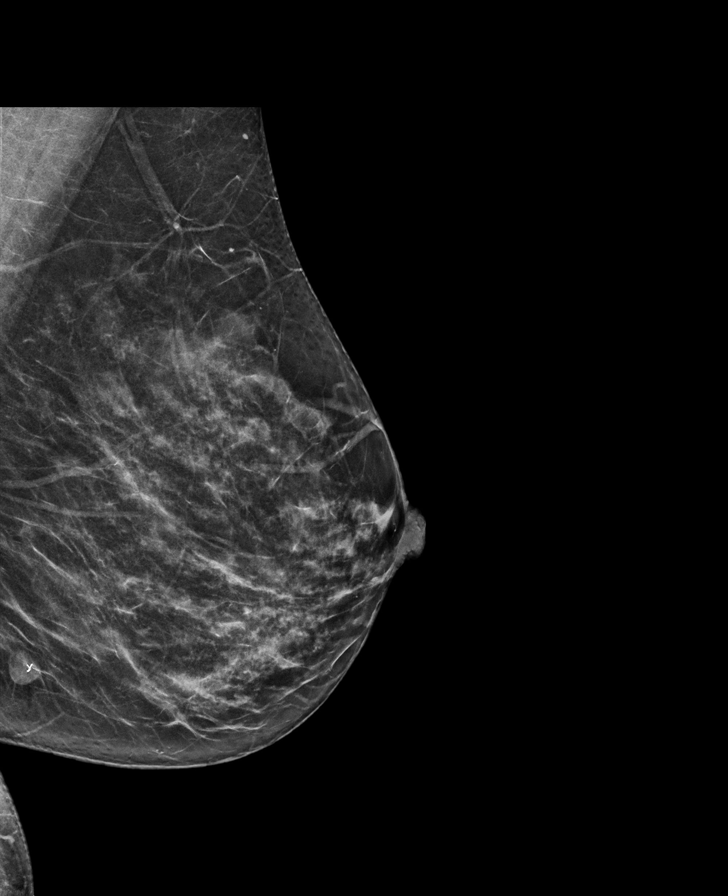

[R MLO synth-2D]
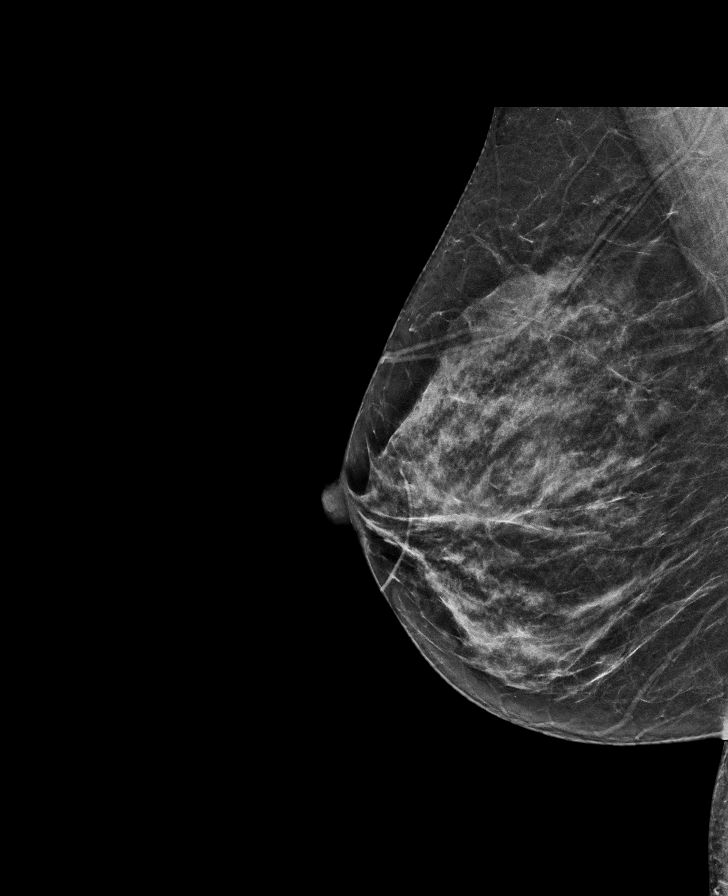

[R CC synth-2D]
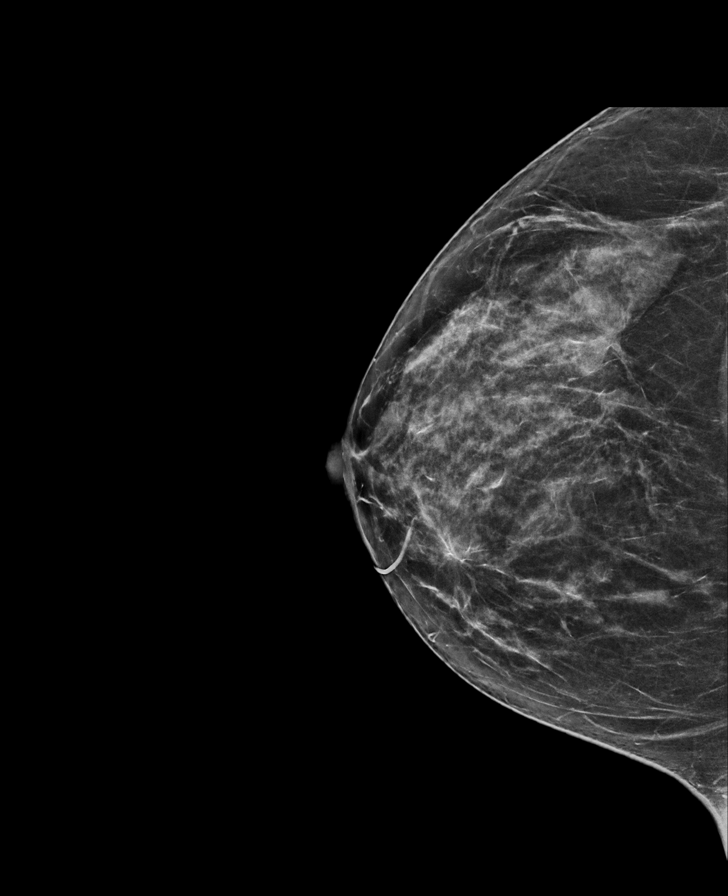

[8 of 28 positions shown; findings below may reference images not displayed]

ACR Breast Density Category c: The breast tissue is heterogeneously
dense, which may obscure small masses.
FINDINGS: There are no findings suspicious for malignancy. Images were
processed with CAD.
IMPRESSION: No mammographic evidence of malignancy. A result letter of this
screening mammogram will be mailed directly to the patient.

RECOMMENDATION:
Screening mammogram in one year. (Code:UA-9-KQN)

BI-RADS CATEGORY  1: Negative.

## 2019-06-15 ENCOUNTER — Other Ambulatory Visit: Payer: Self-pay

## 2019-06-15 ENCOUNTER — Ambulatory Visit
Admission: RE | Admit: 2019-06-15 | Discharge: 2019-06-15 | Disposition: A | Discharge status: Home / Self Care | Payer: BC Managed Care – PPO | Source: Ambulatory Visit | Attending: Family Medicine | Admitting: Family Medicine

## 2019-06-15 DIAGNOSIS — Z1231 Encounter for screening mammogram for malignant neoplasm of breast: Secondary | ICD-10-CM

## 2019-10-23 DIAGNOSIS — L400 Psoriasis vulgaris: Secondary | ICD-10-CM | POA: Diagnosis not present

## 2019-10-23 DIAGNOSIS — M2550 Pain in unspecified joint: Secondary | ICD-10-CM | POA: Diagnosis not present

## 2019-10-26 DIAGNOSIS — Z01419 Encounter for gynecological examination (general) (routine) without abnormal findings: Secondary | ICD-10-CM | POA: Diagnosis not present

## 2019-12-14 DIAGNOSIS — E559 Vitamin D deficiency, unspecified: Secondary | ICD-10-CM | POA: Diagnosis not present

## 2019-12-14 DIAGNOSIS — Z79899 Other long term (current) drug therapy: Secondary | ICD-10-CM | POA: Diagnosis not present

## 2019-12-14 DIAGNOSIS — E785 Hyperlipidemia, unspecified: Secondary | ICD-10-CM | POA: Diagnosis not present

## 2019-12-14 DIAGNOSIS — F329 Major depressive disorder, single episode, unspecified: Secondary | ICD-10-CM | POA: Diagnosis not present

## 2020-05-08 ENCOUNTER — Other Ambulatory Visit: Payer: Self-pay | Admitting: Family Medicine

## 2020-05-08 DIAGNOSIS — Z1231 Encounter for screening mammogram for malignant neoplasm of breast: Secondary | ICD-10-CM

## 2020-06-17 ENCOUNTER — Other Ambulatory Visit: Payer: Self-pay

## 2020-06-17 ENCOUNTER — Ambulatory Visit
Admission: RE | Admit: 2020-06-17 | Discharge: 2020-06-17 | Disposition: A | Discharge status: Home / Self Care | Payer: BC Managed Care – PPO | Source: Ambulatory Visit | Attending: Family Medicine | Admitting: Family Medicine

## 2020-06-17 ENCOUNTER — Ambulatory Visit: Payer: BC Managed Care – PPO

## 2020-06-17 DIAGNOSIS — Z1231 Encounter for screening mammogram for malignant neoplasm of breast: Secondary | ICD-10-CM

## 2020-10-22 DIAGNOSIS — L4059 Other psoriatic arthropathy: Secondary | ICD-10-CM | POA: Diagnosis not present

## 2020-10-22 DIAGNOSIS — L400 Psoriasis vulgaris: Secondary | ICD-10-CM | POA: Diagnosis not present

## 2020-10-22 DIAGNOSIS — M2550 Pain in unspecified joint: Secondary | ICD-10-CM | POA: Diagnosis not present

## 2020-10-30 DIAGNOSIS — Z01419 Encounter for gynecological examination (general) (routine) without abnormal findings: Secondary | ICD-10-CM | POA: Diagnosis not present

## 2020-11-25 IMAGING — MG DIGITAL DIAGNOSTIC UNILATERAL LEFT MAMMOGRAM WITH TOMO AND CAD
4 series · 4 of 12 positions shown · non-contrast
Comparison: Previous exam(s).

CLINICAL DATA: Patient presents with history of a left breast rash,
which has since resolved. She believes there still some left breast
swelling. No reported lumps.

EXAM:
DIGITAL DIAGNOSTIC UNILATERAL LEFT MAMMOGRAM WITH CAD AND TOMO

[L CC synth-2D]
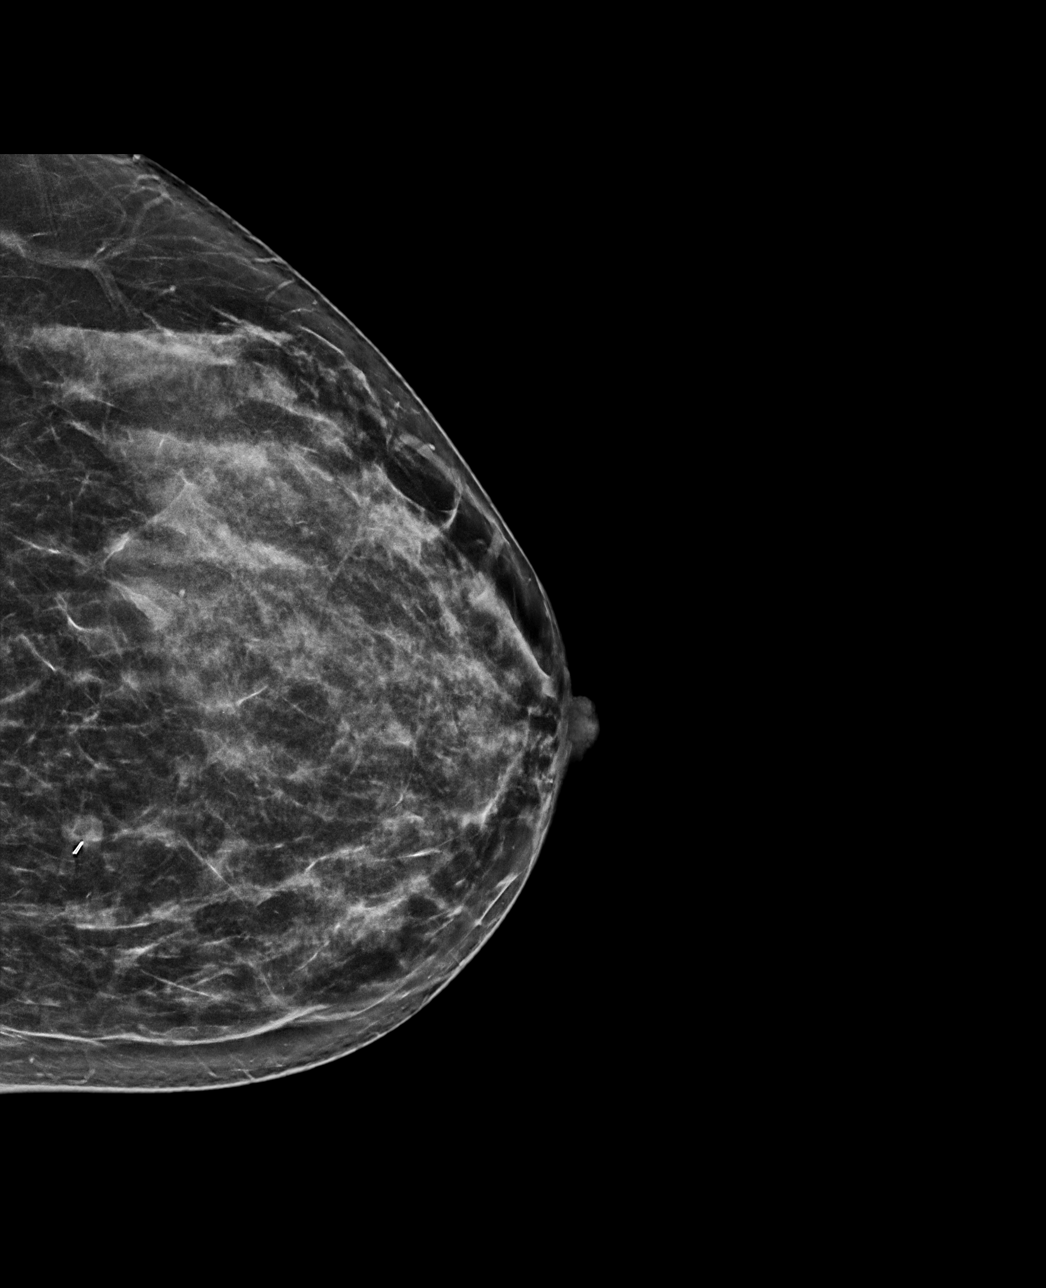

[L MLO synth-2D]
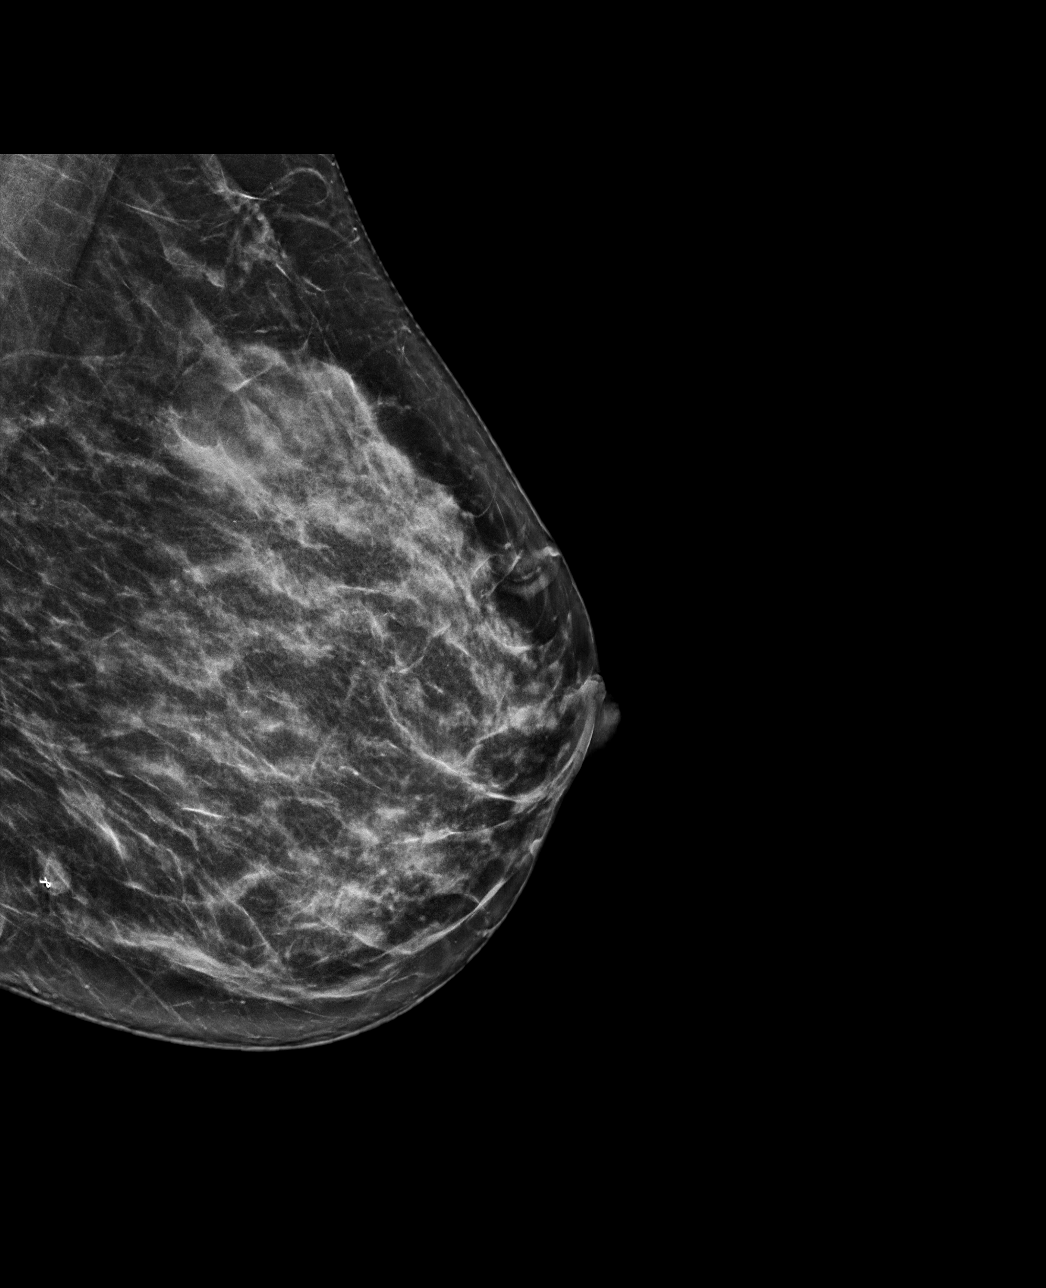

[L MLO tomo · tomo slice 35/70.0]
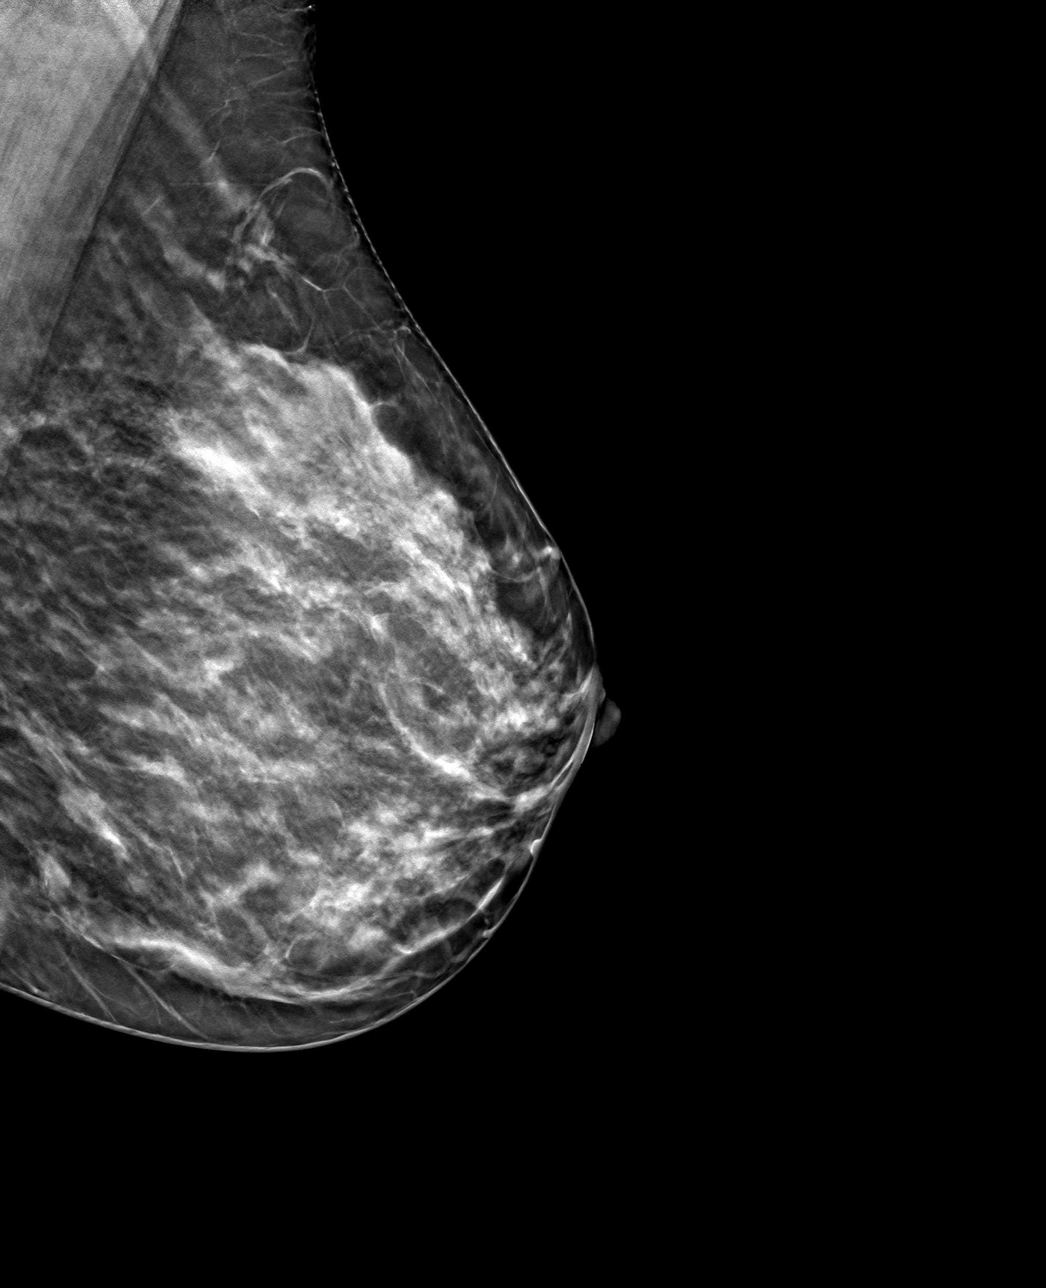

[L CC tomo · tomo slice 34/67.0]
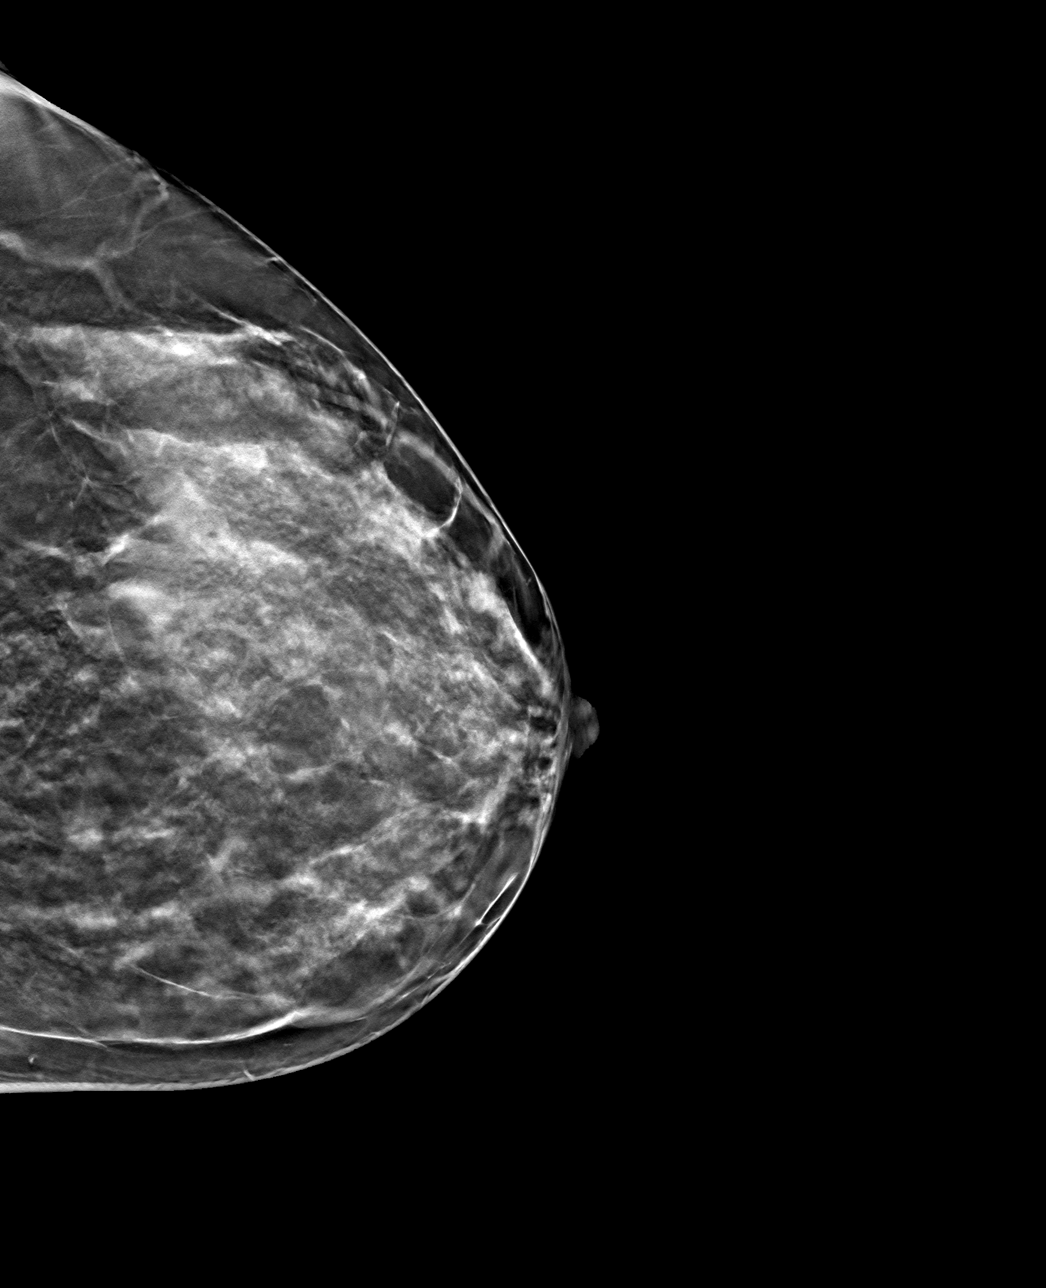

[4 of 12 positions shown; findings below may reference images not displayed]

ACR Breast Density Category c: The breast tissue is heterogeneously
dense, which may obscure small masses.
FINDINGS: There are no new or suspicious masses, no areas of architectural
distortion, no suspicious calcifications and no areas of significant
asymmetry. Previously biopsied benign mass in the inferior, medial
left breast is stable.

Mammographic images were processed with CAD.
IMPRESSION: 1. No evidence of breast malignancy.

RECOMMENDATION:
Screening mammogram in May 2019.(Code:UY-Y-KVQ)

I have discussed the findings and recommendations with the patient.
Results were also provided in writing at the conclusion of the
visit. If applicable, a reminder letter will be sent to the patient
regarding the next appointment.

BI-RADS CATEGORY  1: Negative.

## 2020-12-17 DIAGNOSIS — E785 Hyperlipidemia, unspecified: Secondary | ICD-10-CM | POA: Diagnosis not present

## 2020-12-17 DIAGNOSIS — Z79899 Other long term (current) drug therapy: Secondary | ICD-10-CM | POA: Diagnosis not present

## 2020-12-17 DIAGNOSIS — E559 Vitamin D deficiency, unspecified: Secondary | ICD-10-CM | POA: Diagnosis not present

## 2020-12-17 DIAGNOSIS — F329 Major depressive disorder, single episode, unspecified: Secondary | ICD-10-CM | POA: Diagnosis not present

## 2021-05-15 ENCOUNTER — Other Ambulatory Visit: Payer: Self-pay | Admitting: Family Medicine

## 2021-05-26 DIAGNOSIS — R234 Changes in skin texture: Secondary | ICD-10-CM | POA: Diagnosis not present

## 2021-05-26 DIAGNOSIS — N6311 Unspecified lump in the right breast, upper outer quadrant: Secondary | ICD-10-CM | POA: Diagnosis not present

## 2021-06-06 ENCOUNTER — Other Ambulatory Visit: Payer: Self-pay | Admitting: Family Medicine

## 2021-06-06 DIAGNOSIS — R239 Unspecified skin changes: Secondary | ICD-10-CM

## 2021-06-06 DIAGNOSIS — N6311 Unspecified lump in the right breast, upper outer quadrant: Secondary | ICD-10-CM

## 2021-06-26 ENCOUNTER — Other Ambulatory Visit: Payer: Self-pay

## 2021-06-26 ENCOUNTER — Ambulatory Visit
Admission: RE | Admit: 2021-06-26 | Discharge: 2021-06-26 | Disposition: A | Discharge status: Home / Self Care | Payer: BC Managed Care – PPO | Source: Ambulatory Visit | Attending: Family Medicine | Admitting: Family Medicine

## 2021-06-26 ENCOUNTER — Other Ambulatory Visit: Payer: Self-pay | Admitting: Family Medicine

## 2021-06-26 DIAGNOSIS — R239 Unspecified skin changes: Secondary | ICD-10-CM

## 2021-06-26 DIAGNOSIS — N6311 Unspecified lump in the right breast, upper outer quadrant: Secondary | ICD-10-CM | POA: Diagnosis not present

## 2021-06-26 DIAGNOSIS — N6459 Other signs and symptoms in breast: Secondary | ICD-10-CM | POA: Diagnosis not present

## 2021-06-26 DIAGNOSIS — R922 Inconclusive mammogram: Secondary | ICD-10-CM | POA: Diagnosis not present

## 2021-09-24 DIAGNOSIS — Z8371 Family history of colonic polyps: Secondary | ICD-10-CM | POA: Diagnosis not present

## 2021-09-24 DIAGNOSIS — K648 Other hemorrhoids: Secondary | ICD-10-CM | POA: Diagnosis not present

## 2021-09-24 DIAGNOSIS — Z1211 Encounter for screening for malignant neoplasm of colon: Secondary | ICD-10-CM | POA: Diagnosis not present

## 2021-11-02 DIAGNOSIS — Z01419 Encounter for gynecological examination (general) (routine) without abnormal findings: Secondary | ICD-10-CM | POA: Diagnosis not present

## 2021-11-05 DIAGNOSIS — N949 Unspecified condition associated with female genital organs and menstrual cycle: Secondary | ICD-10-CM | POA: Diagnosis not present

## 2021-12-30 ENCOUNTER — Other Ambulatory Visit: Payer: Self-pay | Admitting: Family Medicine

## 2021-12-30 ENCOUNTER — Ambulatory Visit
Admission: RE | Admit: 2021-12-30 | Discharge: 2021-12-30 | Disposition: A | Discharge status: Home / Self Care | Payer: BC Managed Care – PPO | Source: Ambulatory Visit | Attending: Family Medicine | Admitting: Family Medicine

## 2021-12-30 DIAGNOSIS — R922 Inconclusive mammogram: Secondary | ICD-10-CM | POA: Diagnosis not present

## 2021-12-30 DIAGNOSIS — N6311 Unspecified lump in the right breast, upper outer quadrant: Secondary | ICD-10-CM | POA: Diagnosis not present

## 2021-12-30 DIAGNOSIS — N6310 Unspecified lump in the right breast, unspecified quadrant: Secondary | ICD-10-CM

## 2022-01-05 DIAGNOSIS — E785 Hyperlipidemia, unspecified: Secondary | ICD-10-CM | POA: Diagnosis not present

## 2022-01-05 DIAGNOSIS — F329 Major depressive disorder, single episode, unspecified: Secondary | ICD-10-CM | POA: Diagnosis not present

## 2022-01-05 DIAGNOSIS — E559 Vitamin D deficiency, unspecified: Secondary | ICD-10-CM | POA: Diagnosis not present

## 2022-01-20 DIAGNOSIS — E785 Hyperlipidemia, unspecified: Secondary | ICD-10-CM | POA: Diagnosis not present

## 2022-01-20 DIAGNOSIS — E559 Vitamin D deficiency, unspecified: Secondary | ICD-10-CM | POA: Diagnosis not present

## 2022-05-11 DIAGNOSIS — E559 Vitamin D deficiency, unspecified: Secondary | ICD-10-CM | POA: Diagnosis not present

## 2022-05-11 DIAGNOSIS — E7800 Pure hypercholesterolemia, unspecified: Secondary | ICD-10-CM | POA: Diagnosis not present

## 2022-06-29 ENCOUNTER — Ambulatory Visit
Admission: RE | Admit: 2022-06-29 | Discharge: 2022-06-29 | Disposition: A | Discharge status: Home / Self Care | Payer: BC Managed Care – PPO | Source: Ambulatory Visit | Attending: Family Medicine | Admitting: Family Medicine

## 2022-06-29 DIAGNOSIS — N6311 Unspecified lump in the right breast, upper outer quadrant: Secondary | ICD-10-CM | POA: Diagnosis not present

## 2022-06-29 DIAGNOSIS — R922 Inconclusive mammogram: Secondary | ICD-10-CM | POA: Diagnosis not present

## 2022-06-29 DIAGNOSIS — N6310 Unspecified lump in the right breast, unspecified quadrant: Secondary | ICD-10-CM

## 2022-11-05 DIAGNOSIS — Z01419 Encounter for gynecological examination (general) (routine) without abnormal findings: Secondary | ICD-10-CM | POA: Diagnosis not present

## 2023-05-16 ENCOUNTER — Other Ambulatory Visit: Payer: Self-pay | Admitting: Family Medicine

## 2023-05-16 DIAGNOSIS — N6310 Unspecified lump in the right breast, unspecified quadrant: Secondary | ICD-10-CM

## 2023-06-10 DIAGNOSIS — E7800 Pure hypercholesterolemia, unspecified: Secondary | ICD-10-CM | POA: Diagnosis not present

## 2023-06-10 DIAGNOSIS — Z79899 Other long term (current) drug therapy: Secondary | ICD-10-CM | POA: Diagnosis not present

## 2023-06-10 DIAGNOSIS — F329 Major depressive disorder, single episode, unspecified: Secondary | ICD-10-CM | POA: Diagnosis not present

## 2023-06-10 DIAGNOSIS — E559 Vitamin D deficiency, unspecified: Secondary | ICD-10-CM | POA: Diagnosis not present

## 2023-07-01 ENCOUNTER — Other Ambulatory Visit: Payer: BC Managed Care – PPO

## 2023-07-05 ENCOUNTER — Ambulatory Visit
Admission: RE | Admit: 2023-07-05 | Discharge: 2023-07-05 | Disposition: A | Discharge status: Home / Self Care | Payer: BC Managed Care – PPO | Source: Ambulatory Visit | Attending: Family Medicine | Admitting: Family Medicine

## 2023-07-05 DIAGNOSIS — N6310 Unspecified lump in the right breast, unspecified quadrant: Secondary | ICD-10-CM

## 2023-07-05 DIAGNOSIS — N6311 Unspecified lump in the right breast, upper outer quadrant: Secondary | ICD-10-CM | POA: Diagnosis not present

## 2023-11-16 DIAGNOSIS — Z01419 Encounter for gynecological examination (general) (routine) without abnormal findings: Secondary | ICD-10-CM | POA: Diagnosis not present

## 2024-06-07 ENCOUNTER — Other Ambulatory Visit: Payer: Self-pay | Admitting: Family Medicine

## 2024-06-07 DIAGNOSIS — Z1231 Encounter for screening mammogram for malignant neoplasm of breast: Secondary | ICD-10-CM

## 2024-07-05 ENCOUNTER — Ambulatory Visit
